# Patient Record
Sex: Female | Born: 1970 | Race: White | Hispanic: No | Smoking: Current some day smoker
Health system: Southern US, Community
[De-identification: ages and names within clinical notes are randomized; demographics above are authoritative.]

## PROBLEM LIST (undated history)

## (undated) DIAGNOSIS — M5030 Other cervical disc degeneration, unspecified cervical region: Secondary | ICD-10-CM

## (undated) DIAGNOSIS — F909 Attention-deficit hyperactivity disorder, unspecified type: Secondary | ICD-10-CM

## (undated) DIAGNOSIS — G5603 Carpal tunnel syndrome, bilateral upper limbs: Secondary | ICD-10-CM

## (undated) DIAGNOSIS — K259 Gastric ulcer, unspecified as acute or chronic, without hemorrhage or perforation: Secondary | ICD-10-CM

## (undated) DIAGNOSIS — F308 Other manic episodes: Principal | ICD-10-CM

## (undated) DIAGNOSIS — F419 Anxiety disorder, unspecified: Secondary | ICD-10-CM

## (undated) HISTORY — DX: Other cervical disc degeneration, unspecified cervical region: M50.30

## (undated) HISTORY — PX: TONSILLECTOMY: SUR1361

## (undated) HISTORY — PX: ABDOMINAL HYSTERECTOMY: SHX81

## (undated) HISTORY — DX: Carpal tunnel syndrome, bilateral upper limbs: G56.03

## (undated) HISTORY — DX: Gastric ulcer, unspecified as acute or chronic, without hemorrhage or perforation: K25.9

## (undated) HISTORY — DX: Other manic episodes: F30.8

## (undated) HISTORY — DX: Anxiety disorder, unspecified: F41.9

## (undated) HISTORY — PX: OTHER SURGICAL HISTORY: SHX169

## (undated) HISTORY — DX: Attention-deficit hyperactivity disorder, unspecified type: F90.9

## (undated) HISTORY — PX: BREAST SURGERY: SHX581

## (undated) HISTORY — PX: LIPOSUCTION: SHX10

---

## 2012-07-06 DIAGNOSIS — G5603 Carpal tunnel syndrome, bilateral upper limbs: Secondary | ICD-10-CM

## 2012-07-06 DIAGNOSIS — K259 Gastric ulcer, unspecified as acute or chronic, without hemorrhage or perforation: Secondary | ICD-10-CM

## 2012-07-06 DIAGNOSIS — M5030 Other cervical disc degeneration, unspecified cervical region: Secondary | ICD-10-CM

## 2012-07-06 HISTORY — DX: Carpal tunnel syndrome, bilateral upper limbs: G56.03

## 2012-07-06 HISTORY — DX: Gastric ulcer, unspecified as acute or chronic, without hemorrhage or perforation: K25.9

## 2012-07-06 HISTORY — DX: Other cervical disc degeneration, unspecified cervical region: M50.30

## 2013-01-26 DIAGNOSIS — F988 Other specified behavioral and emotional disorders with onset usually occurring in childhood and adolescence: Secondary | ICD-10-CM | POA: Insufficient documentation

## 2013-01-26 DIAGNOSIS — M722 Plantar fascial fibromatosis: Secondary | ICD-10-CM | POA: Insufficient documentation

## 2014-01-01 DIAGNOSIS — G5600 Carpal tunnel syndrome, unspecified upper limb: Secondary | ICD-10-CM | POA: Insufficient documentation

## 2014-01-01 DIAGNOSIS — M542 Cervicalgia: Secondary | ICD-10-CM | POA: Insufficient documentation

## 2014-01-01 DIAGNOSIS — M5030 Other cervical disc degeneration, unspecified cervical region: Secondary | ICD-10-CM | POA: Insufficient documentation

## 2014-11-09 ENCOUNTER — Ambulatory Visit (INDEPENDENT_AMBULATORY_CARE_PROVIDER_SITE_OTHER): Payer: 59 | Admitting: Physician Assistant

## 2014-11-09 ENCOUNTER — Encounter (HOSPITAL_COMMUNITY): Payer: Self-pay | Admitting: Physician Assistant

## 2014-11-09 VITALS — BP 136/80 | HR 84 | Ht 66.0 in | Wt 141.0 lb

## 2014-11-09 DIAGNOSIS — Z9149 Other personal history of psychological trauma, not elsewhere classified: Secondary | ICD-10-CM | POA: Diagnosis not present

## 2014-11-09 DIAGNOSIS — F309 Manic episode, unspecified: Secondary | ICD-10-CM

## 2014-11-09 DIAGNOSIS — F1210 Cannabis abuse, uncomplicated: Secondary | ICD-10-CM | POA: Diagnosis not present

## 2014-11-09 DIAGNOSIS — F1994 Other psychoactive substance use, unspecified with psychoactive substance-induced mood disorder: Secondary | ICD-10-CM

## 2014-11-09 DIAGNOSIS — IMO0002 Reserved for concepts with insufficient information to code with codable children: Secondary | ICD-10-CM

## 2014-11-09 MED ORDER — DIVALPROEX SODIUM 250 MG PO DR TAB
DELAYED_RELEASE_TABLET | ORAL | Status: DC
Start: 2014-11-09 — End: 2014-11-22
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
## 2014-11-09 NOTE — Patient Instructions (Signed)
1. Take all of your medications as discussed with your provider. (Please check your AVS, for the list.) 2. Call this office for any questions or problems. 3. Be sure to get plenty of rest and try for 7-9 hours of quality sleep each night. 4. Try to get regular exercise, at least 15-30 minutes each day.  A good walk will help tremendously! 5. Remember to do your mindfulness each day, breath deeply in and out, while having quiet reflection, prayer, meditation, or positive visualization. Unplug and turn off all electronic devices each day for your own personal time without interruption. This works! There are studies to back this up! 6. Be sure to take your B complex and Vitamin D3 each day. This will improve your overall wellbeing and boost your immune system as well. 7. Try to eat a nutritious healthy diet and avoid excessive alcohol and ALL tobacco products. 8. Be sure to keep all of your appointments with your outpatient therapist. If you do not have one, our office will be happy to assist you with this. 9. Be sure to keep your next follow up appointment in 2 weeks. 

## 2014-11-14 ENCOUNTER — Telehealth (HOSPITAL_COMMUNITY): Payer: Self-pay | Admitting: *Deleted

## 2014-11-14 ENCOUNTER — Encounter (HOSPITAL_COMMUNITY): Payer: Self-pay | Admitting: *Deleted

## 2014-11-14 NOTE — Telephone Encounter (Addendum)
Pt would like to speak to  [name]. Pt would like to increase divalproex (DEPAKOTE) 250 MG DR tablet to T.I.D. Please call to advise at. [phone]  Patient noted that she has not slept yet and wanted to increase the depakote to TID. She states she has already started this. She did have her labs done, and her Liver enzymes are elevated.  She is advised to take two Depakote in AM and 2 at hs. She is advised to keep her follow up appointment. She is advised not to drink Alcohol or to use any further Adderall.  She agrees to do this. And she will keep her follow up appointment.  Charlotte Sanchez T. Mashburn RPAC 5:54 PM 11/14/2014

## 2014-11-22 ENCOUNTER — Encounter (HOSPITAL_COMMUNITY): Payer: Self-pay | Admitting: Physician Assistant

## 2014-11-22 ENCOUNTER — Ambulatory Visit (INDEPENDENT_AMBULATORY_CARE_PROVIDER_SITE_OTHER): Payer: 59 | Admitting: Physician Assistant

## 2014-11-22 VITALS — BP 116/70 | HR 88 | Ht 66.0 in | Wt 148.0 lb

## 2014-11-22 DIAGNOSIS — F1021 Alcohol dependence, in remission: Secondary | ICD-10-CM

## 2014-11-22 DIAGNOSIS — F3010 Manic episode without psychotic symptoms, unspecified: Secondary | ICD-10-CM | POA: Diagnosis not present

## 2014-11-22 DIAGNOSIS — IMO0002 Reserved for concepts with insufficient information to code with codable children: Secondary | ICD-10-CM

## 2014-11-22 DIAGNOSIS — F1994 Other psychoactive substance use, unspecified with psychoactive substance-induced mood disorder: Secondary | ICD-10-CM

## 2014-11-22 DIAGNOSIS — F308 Other manic episodes: Secondary | ICD-10-CM

## 2014-11-22 DIAGNOSIS — F309 Manic episode, unspecified: Secondary | ICD-10-CM

## 2014-11-22 DIAGNOSIS — F1210 Cannabis abuse, uncomplicated: Secondary | ICD-10-CM

## 2014-11-22 HISTORY — DX: Other manic episodes: F30.8

## 2014-11-22 MED ORDER — DIVALPROEX SODIUM 250 MG PO DR TAB: DELAYED_RELEASE_TABLET | ORAL | Status: DC

## 2014-11-22 NOTE — Progress Notes (Signed)
[facility]  11/22/2014 1:23 PM Charlotte LewisMichelle Sanchez  MRN:  [id number] Subjective:  Patient notes that she is better. She states she doing much better. Thinks she may have had DTs, but had no complications, hasn't had any alcohol in two weeks. Her symptoms lasted 4 full days, she noted itching, sweats, nightmares, chills, nausea, feeling like her skin was crawling, low grade headache, but no hallucinations, no seizures.      Today she feels much better, family is noticing a difference. Her family [place] coming in from [place] tomorrow and she will be happy to see the differences. She says her friends have really noticed a big difference. She is very happy with her progress.  She states no alcohol, but she continues to smoke weed 4x a day. Principal Problem: Hypomania Diagnosis:   Patient Active Problem List   Diagnosis Date Noted  . 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 DDD (degenerative disc disease), cervical [M50.30] 01/01/2014  . Cervical pain [M54.2] 01/01/2014  . Carpal tunnel syndrome [G56.00] 01/01/2014  . Plantar fasciitis [M72.2] 01/26/2013  . ADD (attention deficit disorder) [F90.9] 01/26/2013   Total Time spent with patient: 20 minutes   Past Medical History:  Past Medical History  Diagnosis Date  . ADHD (attention deficit hyperactivity disorder)   . Anxiety   . Gastric ulcer 2014  . DDD (degenerative disc disease), cervical 2014  . Carpal tunnel syndrome, bilateral 2014  . Hypomania 11/22/2014    Past Surgical History  Procedure Laterality Date  . Abdominal hysterectomy    . Breast surgery    . Cosmestic surgergy      Tummy tuck  . Tonsillectomy    . Liposuction     Family History: No family history on file. Social History:  History  Alcohol Use  . 0.0 oz/week  . 0 Standard drinks or equivalent per week    Comment: 4 glasses of wine per day.     History  Drug Use  . Yes    History   Social History  . Marital Status: Single    Spouse Name: N/A  . Number of  Children: N/A  . Years of Education: N/A   Social History Main Topics  . Smoking status: Never Smoker   . Smokeless tobacco: Never Used  . Alcohol Use: 0.0 oz/week    0 Standard drinks or equivalent per week     Comment: 4 glasses of wine per day.  . Drug Use: Yes  . Sexual Activity:    Partners: Male   Other Topics Concern  . None   Social History Narrative   Additional History:    Sleep: not  So good yet  Appetite:  Good   Assessment:   Musculoskeletal: Strength & Muscle Tone: within normal limits Gait & Station: normal Patient leans: Normal   Psychiatric Specialty Exam: Physical Exam  ROS  There were no vitals taken for this visit.There is no weight on file to calculate BMI.  General Appearance: Fairly Groomed  Patent attorneyye Contact::  Good  Speech:  Pressured  Volume:  Normal  Mood:  Euphoric  Affect:  Congruent  Thought Process:  Circumstantial  Orientation:  Full (Time, Place, and Person)  Thought Content:  WDL  Suicidal Thoughts:  No  Homicidal Thoughts:  No  Memory:  Immediate;   Fair Recent;   Fair Remote;   Fair  Judgement:  Fair  Insight:  Good  Psychomotor Activity:  Increased  Concentration:  fair  Recall:  Good  Fund of Knowledge:Good  Language: Good  Akathisia:  No  Handed:  Right  AIMS (if indicated):     Assets:  Communication Skills Desire for Improvement Financial Resources/Insurance Housing Leisure Time Physical Health Resilience Social Support Talents/Skills Transportation Vocational/Educational  ADL's:  Intact  Cognition: WNL  Sleep:        Current Medications: Current Outpatient Prescriptions  Medication Sig Dispense Refill  . divalproex (DEPAKOTE) 250 MG DR tablet Take two tablets tonight at bedtime, then 1 tablet each morning and each evening. 60 tablet 0  . clotrimazole (LOTRIMIN) 1 % cream   0   No current facility-administered medications for this visit.    Lab Results: No results found for this or any previous  visit (from the past 48 hour(s)).  Physical Findings: AIMS:  CIWA:   COWS:    Treatment Plan Summary: Hypomanic Alcohol abuse-early remission Cannabis abuse-chronic   Medical Decision Making:  Established Problem, Stable/Improving (1) and New Problem, with no additional work-up planned (3)  1. Depakote 250mg   Take as follows:     500mg  in AM with breakfast     250mg  at lunch     750mg  at bedtime with a snack. 2. Continue to get regular exercise as discussed. 3. Decrease Cannabis use as discussed. 4. Follow up 3-4 weeks. 5. Will do Depokote level at that time.    Charlotte Sanchez RPAC 1:32 PM 11/22/2014

## 2014-11-22 NOTE — Patient Instructions (Signed)
1. Take all of your medications as discussed with your provider. (Please check your AVS, for the list.)  2. Call this office for any questions or problems.  3. Be sure to get plenty of rest and try for 7-9 hours of quality sleep each night.  4. Try to get regular exercise, at least 15-30 minutes each day.  A good walk will help tremendously!  5. Remember to do your mindfulness each day, breath deeply in and out, while having quiet reflection, prayer, meditation, or positive visualization. Unplug and turn off all electronic devices each day for your own personal time without interruption. This works! There are studies to back this up!  6. Be sure to take your B complex and Vitamin D3 each day. This will improve your overall wellbeing and boost your immune system as well.  7. Try to eat a nutritious healthy diet and avoid all alcohol and ALL tobacco products.  8. Be sure to keep all of your appointments with your outpatient therapist. If you do not have one, our office will be happy to assist you with this.  9. Be sure to keep your next follow up appointment in

## 2014-11-23 ENCOUNTER — Ambulatory Visit (HOSPITAL_COMMUNITY): Payer: 59 | Admitting: Physician Assistant

## 2014-12-13 ENCOUNTER — Ambulatory Visit (HOSPITAL_COMMUNITY): Payer: 59 | Admitting: Physician Assistant

## 2014-12-13 ENCOUNTER — Encounter (HOSPITAL_COMMUNITY): Payer: Self-pay | Admitting: Physician Assistant

## 2014-12-13 VITALS — BP 110/66 | HR 86 | Ht 66.0 in | Wt 148.0 lb

## 2014-12-14 ENCOUNTER — Ambulatory Visit (INDEPENDENT_AMBULATORY_CARE_PROVIDER_SITE_OTHER): Payer: 59 | Admitting: Physician Assistant

## 2014-12-14 ENCOUNTER — Encounter (HOSPITAL_COMMUNITY): Payer: Self-pay | Admitting: Physician Assistant

## 2014-12-14 VITALS — BP 119/79 | HR 90 | Ht 66.5 in | Wt 148.0 lb

## 2014-12-14 DIAGNOSIS — Z9149 Other personal history of psychological trauma, not elsewhere classified: Secondary | ICD-10-CM

## 2014-12-14 DIAGNOSIS — F1210 Cannabis abuse, uncomplicated: Secondary | ICD-10-CM | POA: Diagnosis not present

## 2014-12-14 DIAGNOSIS — F1021 Alcohol dependence, in remission: Secondary | ICD-10-CM

## 2014-12-14 DIAGNOSIS — F309 Manic episode, unspecified: Secondary | ICD-10-CM

## 2014-12-14 DIAGNOSIS — IMO0002 Reserved for concepts with insufficient information to code with codable children: Secondary | ICD-10-CM

## 2014-12-14 DIAGNOSIS — F3010 Manic episode without psychotic symptoms, unspecified: Secondary | ICD-10-CM | POA: Diagnosis not present

## 2014-12-14 DIAGNOSIS — F1994 Other psychoactive substance use, unspecified with psychoactive substance-induced mood disorder: Secondary | ICD-10-CM

## 2014-12-14 DIAGNOSIS — F308 Other manic episodes: Secondary | ICD-10-CM

## 2014-12-14 MED ORDER — DIVALPROEX SODIUM 250 MG PO DR TAB: DELAYED_RELEASE_TABLET | ORAL | Status: AC

## 2014-12-14 NOTE — Progress Notes (Signed)
Patient ID: Charlotte Sanchez, female   DOB: 06-Sep-1970, 44 y.o.   MRN: [id number] [facility] MD Progress Note  12/14/2014 1:16 PM Briyanna Grinnan  MRN:  [id number] Subjective:  Patient states she is doing very well. She has started a new job, but unfortunately she has started drinking again, 2 glasses of wine or 3 beers a day, and she notes that she has cut her THC consumption down by half but she is still smoking daily.  Her mood is speeding up again and she feels that she is apologizing for everything again.  Principal Problem: Hypomania Diagnosis:   Patient Active Problem List   Diagnosis Date Noted  . 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 DDD (degenerative disc disease), cervical [M50.30] 01/01/2014  . Cervical pain [M54.2] 01/01/2014  . Carpal tunnel syndrome [G56.00] 01/01/2014  . Plantar fasciitis [M72.2] 01/26/2013  . ADD (attention deficit disorder) [F90.9] 01/26/2013   Total Time spent with patient: 30 minutes   Past Medical History:  Past Medical History  Diagnosis Date  . ADHD (attention deficit hyperactivity disorder)   . Anxiety   . Gastric ulcer 2014  . DDD (degenerative disc disease), cervical 2014  . Carpal tunnel syndrome, bilateral 2014  . Hypomania 11/22/2014    Past Surgical History  Procedure Laterality Date  . Abdominal hysterectomy    . Breast surgery    . Cosmestic surgergy      Tummy tuck  . Tonsillectomy    . Liposuction     Family History: No family history on file. Social History:  History  Alcohol Use  . 0.0 oz/week  . 0 Standard drinks or equivalent per week    Comment: 2 glasses of wine, 3 beers occassionally     History  Drug Use  . Yes    Comment: smoking weed daily but has cut down consumption    History   Social History  . Marital Status: Single    Spouse Name: N/A  . Number of Children: N/A  . Years of Education: N/A   Social History Main Topics  . Smoking status: Never Smoker   . Smokeless tobacco: Never Used  . Alcohol Use: 0.0  oz/week    0 Standard drinks or equivalent per week     Comment: 2 glasses of wine, 3 beers occassionally  . Drug Use: Yes     Comment: smoking weed daily but has cut down consumption  . Sexual Activity:    Partners: Male   Other Topics Concern  . None   Social History Narrative   Additional History:  Sleep: not  So good yet  Appetite:  Good   Assessment:   Musculoskeletal: Strength & Muscle Tone: within normal limits Gait & Station: normal Patient leans: Normal   Psychiatric Specialty Exam: Physical Exam  ROS  Blood pressure 119/79, pulse 90, height 5' 6.5" (1.689 m), weight 148 lb (67.132 kg).Body mass index is 23.53 kg/(m^2).  General Appearance: Fairly Groomed  Patent attorney::  Good  Speech:  circumstantial  Volume:  Normal  Mood:  euthymic  Affect:  Congruent  Thought Process:  Circumstantial  Orientation:  Full (Time, Place, and Person)  Thought Content:  WDL  Suicidal Thoughts:  No  Homicidal Thoughts:  No  Memory:  Immediate;   Fair Recent;   Fair Remote;   Fair  Judgement:  Fair  Insight:  Good  Psychomotor Activity:  Increased  Concentration:  fair  Recall:  Good  Fund of Knowledge:Good  Language: Good  Akathisia:  No  Handed:  Right  AIMS (if indicated):     Assets:  Communication Skills Desire for Improvement Financial Resources/Insurance Housing Leisure Time Physical Health Resilience Social Support Talents/Skills Transportation Vocational/Educational  ADL's:  Intact  Cognition: WNL  Sleep:   fair     Current Medications: Current Outpatient Prescriptions  Medication Sig Dispense Refill  . celecoxib (CELEBREX) 200 MG capsule     . clotrimazole (LOTRIMIN) 1 % cream   0  . divalproex (DEPAKOTE) 250 MG DR tablet Take two tablets each morning ( ), then  at lunch, then  at Bedtime with a snack. 180 tablet 0   No current facility-administered medications for this visit.    Lab Results: No results found for this or  any previous visit (from the past 48 hour(s)).  Physical Findings: AIMS:  CIWA:   COWS:    Treatment Plan Summary: Hypomanic Alcohol abuse-relapsed Cannabis abuse-chronic  Assessment: patient has enjoyed the success of her changes in her behavior but has over compensated and begun using ETOH again, but has cut her THC consumption in half. After discussion Sharda has elected to d/c [facility] and she will use ETOH 2 x per week, no greater than 1 glass of wine or 1 beer per time.  Medical Decision Making:  Established Problem, Stable/Improving (1) and New Problem, with no additional work-up planned (3)  1. Depakote   Take as follows:      in AM with breakfast      at lunch      at bedtime with a snack. 2. Continue to get regular exercise as discussed. 3. Will order Depakote level today. 4. Follow up 3-4 weeks.     Will also order CMP as well.    Rona Ravens. Kahlen Morais RPAC 1:16 PM 12/14/2014

## 2014-12-14 NOTE — Patient Instructions (Signed)
1. Continue all medication as ordered. 2. Call this office if you have any questions or concerns. 3. Continue to get regular exercise 3-5 times a week. 4. Continue to eat a healthy nutritionally balanced diet. 5. Continue to reduce stress and anxiety through activities such as yoga, mindfulness, meditation and or prayer. 6. Keep all appointments with your out patient therapist and have notes forwarded to this office. (If you do not have one and would like to be scheduled with a therapist, please let our office assist you with this. 7. Follow up as planned 3-4 weeks.  Get your labs as ordered today.

## 2014-12-15 LAB — HEPATIC FUNCTION PANEL
ALK PHOS: 52 U/L (ref 39–117)
ALT: 66 U/L — ABNORMAL HIGH (ref 0–35)
AST: 94 U/L — AB (ref 0–37)
Albumin: 4.4 g/dL (ref 3.5–5.2)
Bilirubin, Direct: 0.1 mg/dL (ref 0.0–0.3)
Indirect Bilirubin: 0.4 mg/dL (ref 0.2–1.2)
Total Bilirubin: 0.5 mg/dL (ref 0.2–1.2)
Total Protein: 6.8 g/dL (ref 6.0–8.3)

## 2014-12-15 LAB — CMP AND LIVER
ALK PHOS: 52 U/L (ref 39–117)
ALT: 66 U/L — AB (ref 0–35)
AST: 94 U/L — AB (ref 0–37)
Albumin: 4.4 g/dL (ref 3.5–5.2)
BILIRUBIN INDIRECT: 0.4 mg/dL (ref 0.2–1.2)
BUN: 16 mg/dL (ref 6–23)
Bilirubin, Direct: 0.1 mg/dL (ref 0.0–0.3)
CO2: 23 mEq/L (ref 19–32)
Calcium: 9.7 mg/dL (ref 8.4–10.5)
Chloride: 103 mEq/L (ref 96–112)
Creat: 0.63 mg/dL (ref 0.50–1.10)
Glucose, Bld: 91 mg/dL (ref 70–99)
POTASSIUM: 4.8 meq/L (ref 3.5–5.3)
Sodium: 141 mEq/L (ref 135–145)
Total Bilirubin: 0.5 mg/dL (ref 0.2–1.2)
Total Protein: 6.8 g/dL (ref 6.0–8.3)

## 2014-12-15 LAB — VALPROIC ACID LEVEL: VALPROIC ACID LVL: 74.9 ug/mL (ref 50.0–100.0)

## 2014-12-20 ENCOUNTER — Encounter (HOSPITAL_COMMUNITY): Payer: Self-pay | Admitting: Physician Assistant

## 2014-12-23 ENCOUNTER — Other Ambulatory Visit (HOSPITAL_COMMUNITY): Payer: Self-pay | Admitting: Physician Assistant

## 2014-12-26 NOTE — Progress Notes (Signed)
Patient ID: Charlotte Sanchez, female   DOB: [birth year], 43 y.o.   MRN: [id number] OV rescheduled.

## 2015-01-15 ENCOUNTER — Other Ambulatory Visit (HOSPITAL_COMMUNITY): Payer: Self-pay | Admitting: Physician Assistant

## 2015-01-23 NOTE — Telephone Encounter (Signed)
PT will need to contact the office for an appt. 

## 2016-08-04 ENCOUNTER — Institutional Professional Consult (permissible substitution): Payer: 59 | Admitting: Emergency Medicine

## 2016-08-07 ENCOUNTER — Ambulatory Visit (INDEPENDENT_AMBULATORY_CARE_PROVIDER_SITE_OTHER): Payer: Self-pay | Admitting: Emergency Medicine

## 2016-08-07 ENCOUNTER — Encounter: Payer: Self-pay | Admitting: Emergency Medicine

## 2016-08-07 DIAGNOSIS — R053 Chronic cough: Secondary | ICD-10-CM | POA: Insufficient documentation

## 2016-08-07 DIAGNOSIS — R05 Cough: Secondary | ICD-10-CM

## 2016-08-07 MED ORDER — FLUTICASONE PROPIONATE 50 MCG/ACT NA SUSP: 2.0000 | Freq: Every day | NASAL | 2 refills | Status: DC

## 2016-08-07 NOTE — Addendum Note (Signed)
Addended by: Pamalee LeydenWIGGINS, Anyra Kaufman J on: 08/07/2016 04:40 PM   Modules accepted: Orders

## 2016-08-07 NOTE — Progress Notes (Signed)
Subjective:    Patient ID: Charlotte Sanchez, female    DOB: 09/21/1970, 46 y.o.   MRN: [id number]  HPI 46 year old woman, never smoker, with a history of allergies, anxiety, ADD, PUD, EtOH abuse, prior sinus surgery 1989-90 for a trauma. She is a self referral, here today to discuss chronic cough. She has been coughing for several years. She sees greenish mucous that comes from her sinuses and also from her chest / throat. No blood. She can move air through her nose and head. No vertigo but maybe some lightheadedness. Occasional HA. Has been treated for sinusitis before, she can't recall whether it changed her mucous. No SOB, no wheeze.    Review of Systems  Constitutional: Negative.  Negative for fever and unexpected weight change.  HENT: Positive for congestion. Negative for dental problem, ear pain, nosebleeds, postnasal drip, rhinorrhea, sinus pressure, sneezing, sore throat and trouble swallowing.   Eyes: Negative.  Negative for redness and itching.  Respiratory: Positive for cough. Negative for chest tightness, shortness of breath and wheezing.   Cardiovascular: Positive for leg swelling. Negative for palpitations.  Gastrointestinal: Negative.  Negative for nausea and vomiting.  Endocrine: Negative.   Genitourinary: Negative.  Negative for dysuria.  Musculoskeletal: Negative.  Negative for joint swelling.  Skin: Negative.  Negative for rash.  Allergic/Immunologic: Positive for environmental allergies.  Neurological: Negative.  Negative for headaches.  Hematological: Negative.  Does not bruise/bleed easily.  Psychiatric/Behavioral: Negative.  Negative for dysphoric mood. The patient is not nervous/anxious.     Past Medical History:  Diagnosis Date  . ADHD (attention deficit hyperactivity disorder)   . Anxiety   . Carpal tunnel syndrome, bilateral 2014  . DDD (degenerative disc disease), cervical 2014  . Gastric ulcer 2014  . Hypomania (HCC) 11/22/2014     No family history on  file.   Social History   Social History  . Marital status: Single    Spouse name: N/A  . Number of children: N/A  . Years of education: N/A   Occupational History  . Not on file.   Social History Main Topics  . Smoking status: Current Some Day Smoker    Types: E-cigarettes  . Smokeless tobacco: Never Used     Comment: Pt. uses a vape  . Alcohol use 0.0 oz/week     Comment: 2 glasses of wine, 3 beers occassionally  . Drug use: Yes     Comment: smoking weed daily but has cut down consumption  . Sexual activity: Yes    Partners: Male   Other Topics Concern  . Not on file   Social History Narrative  . No narrative on file  [place] she is exposed to dyes and chemicals    No Known Allergies   Outpatient Medications Prior to Visit  Medication Sig Dispense Refill  . celecoxib (CELEBREX) 200 MG capsule     . divalproex (DEPAKOTE) 250 MG DR tablet Take two tablets each morning (500mg ), then 250mg  at lunch, then 750mg  at Bedtime with a snack. 180 tablet 0  . clotrimazole (LOTRIMIN) 1 % cream   0   No facility-administered medications prior to visit.         Objective:   Physical Exam Vitals:   08/07/16 1558  BP: 120/80  Pulse: 77  SpO2: 100%  Weight: 162 lb 3.2 oz (73.6 kg)  Height: 5' 6.5" (1.689 m)   Gen: Pleasant, well-nourished, in no distress,  normal affect  ENT: No lesions,  mouth clear,  oropharynx clear, no postnasal drip  Neck: No JVD, no TMG, no carotid bruits  Lungs: No use of accessory muscles, clear without rales or rhonchi  Cardiovascular: RRR, heart sounds normal, no murmur or gallops, no peripheral edema  Musculoskeletal: No deformities, no cyanosis or clubbing  Neuro: alert, non focal  Skin: Warm, no lesions or rashes      Assessment & Plan:  Chronic cough Based on history I suspect that there are contributions of allergic rhinitis, possibly also chronic sinus disease given her often purulent nasal  discharge and her hx of sinus trauma and sgy. She denies any GERD sx. We will treat allergic disease, start nasal washes. Check sputum cx. CT scan sinuses, CXR.   If no better and depending on w/u may need PFT, FOB.   We will perform a CXR today.  We will perform a CT scan of your sinuses We will get a sputum sample for culture.  Start fluticasone NS, 2 sprays each nostril once a day.  We will start doing nasal saline rinses once a day.  We will consider breathing tests in the future based on our progress We will consider doing bronchoscopy in the future depending on how we progress.  Follow with Dr Delton CoombesByrum in 1 month  Levy Pupaobert Rubena Roseman, MD, PhD 08/07/2016, 4:26 PM [facility] [phone] or if no answer [phone]

## 2016-08-07 NOTE — Assessment & Plan Note (Signed)
Based on history I suspect that there are contributions of allergic rhinitis, possibly also chronic sinus disease given her often purulent nasal discharge and her hx of sinus trauma and sgy. She denies any GERD sx. We will treat allergic disease, start nasal washes. Check sputum cx. CT scan sinuses, CXR.   If no better and depending on w/u may need PFT, FOB.   We will perform a CXR today.  We will perform a CT scan of your sinuses We will get a sputum sample for culture.  Start fluticasone NS, 2 sprays each nostril once a day.  We will start doing nasal saline rinses once a day.  We will consider breathing tests in the future based on our progress We will consider doing bronchoscopy in the future depending on how we progress.  Follow with Dr Delton CoombesByrum in 1 month

## 2016-08-07 NOTE — Addendum Note (Signed)
Addended by: Pamalee LeydenWIGGINS, Alaycia Eardley J on: 08/07/2016 04:45 PM   Modules accepted: Orders

## 2016-08-07 NOTE — Patient Instructions (Addendum)
We will perform a CXR today.  We will perform a CT scan of your sinuses We will get a sputum sample for culture.  Start fluticasone NS, 2 sprays each nostril once a day.  We will start doing nasal saline rinses once a day.  We will consider breathing tests in the future based on our progress We will consider doing bronchoscopy in the future depending on how we progress.  Follow with Dr Delton CoombesByrum in 1 month

## 2016-08-10 ENCOUNTER — Other Ambulatory Visit: Payer: Self-pay

## 2016-08-10 ENCOUNTER — Ambulatory Visit (INDEPENDENT_AMBULATORY_CARE_PROVIDER_SITE_OTHER)
Admission: RE | Admit: 2016-08-10 | Discharge: 2016-08-10 | Disposition: A | Discharge status: Home / Self Care | Payer: Self-pay | Source: Ambulatory Visit | Attending: Emergency Medicine | Admitting: Emergency Medicine

## 2016-08-10 DIAGNOSIS — R053 Chronic cough: Secondary | ICD-10-CM

## 2016-08-10 DIAGNOSIS — R05 Cough: Secondary | ICD-10-CM

## 2016-08-11 ENCOUNTER — Telehealth: Payer: Self-pay | Admitting: Emergency Medicine

## 2016-08-11 NOTE — Telephone Encounter (Signed)
Spoke to pt.  She wants to go ahead & schedule CT at [facility] in [place].  I called [facility] & spoke to [place]ul[place]arol[place].  She states she will call pt to schedule.  I checked & Eber JonesCarolyn has her scheduled for 2/9.  [facility]  Will route back to RB.

## 2016-08-11 NOTE — Telephone Encounter (Signed)
Pt requesting sputum culture and cxr results.  RB please advise.  Thanks!   Pt also states she is returning a call to schedule ct sinus.   [facility]'s please advise.  Thanks.

## 2016-08-12 NOTE — Telephone Encounter (Signed)
resp cx is still pending CXR was normal

## 2016-08-13 LAB — RESPIRATORY CULTURE OR RESPIRATORY AND SPUTUM CULTURE: Organism ID, Bacteria: NORMAL

## 2016-08-14 ENCOUNTER — Ambulatory Visit (HOSPITAL_BASED_OUTPATIENT_CLINIC_OR_DEPARTMENT_OTHER): Payer: Self-pay

## 2016-09-03 ENCOUNTER — Ambulatory Visit (HOSPITAL_BASED_OUTPATIENT_CLINIC_OR_DEPARTMENT_OTHER): Payer: Self-pay

## 2016-09-10 ENCOUNTER — Ambulatory Visit: Payer: Self-pay | Admitting: Emergency Medicine

## 2016-11-22 ENCOUNTER — Other Ambulatory Visit: Payer: Self-pay | Admitting: Emergency Medicine

## 2018-05-16 IMAGING — DX DG CHEST 2V
2 series · 2 of 2 positions shown · non-contrast
Comparison: None.

CLINICAL DATA: Chronic chest congestion and cough.

EXAM:
CHEST  2 VIEW

[chest pa]
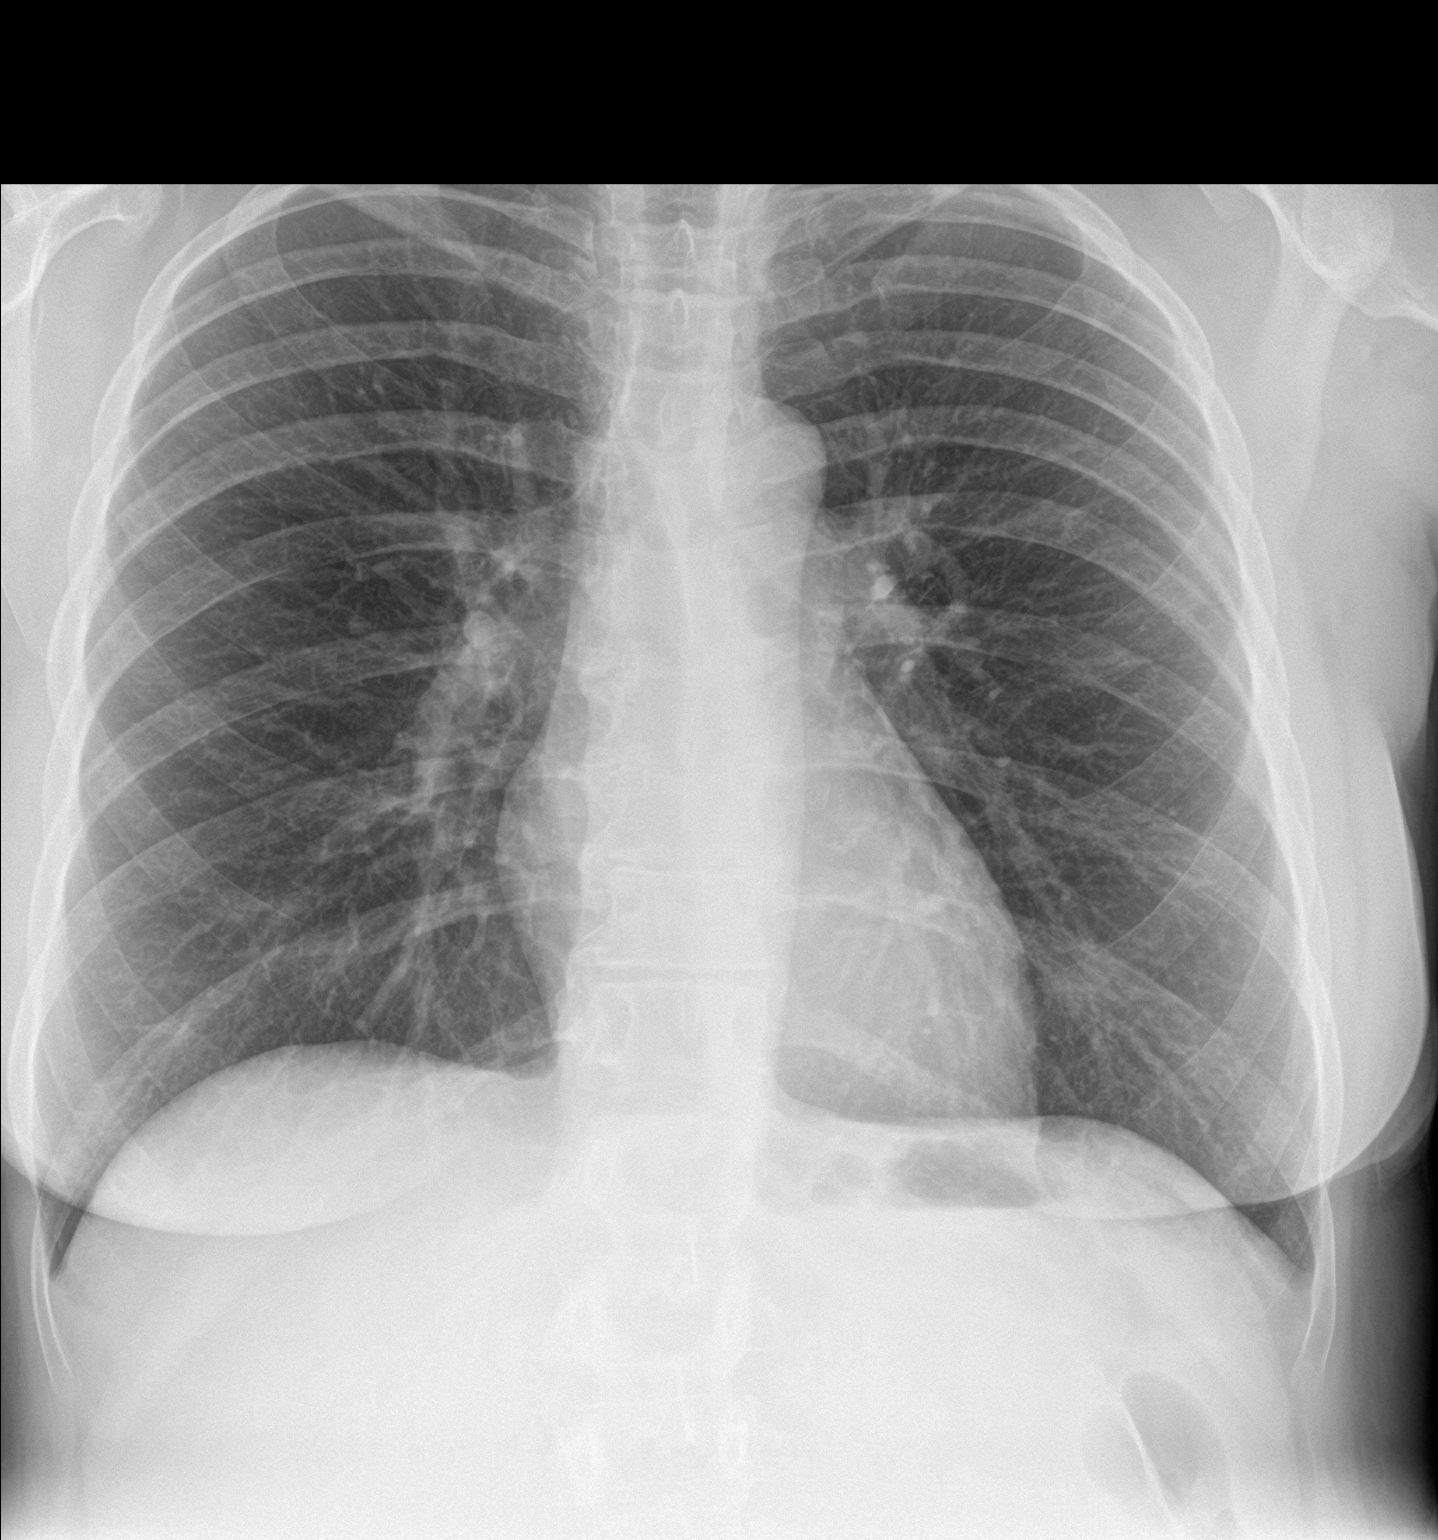

[chest lat]
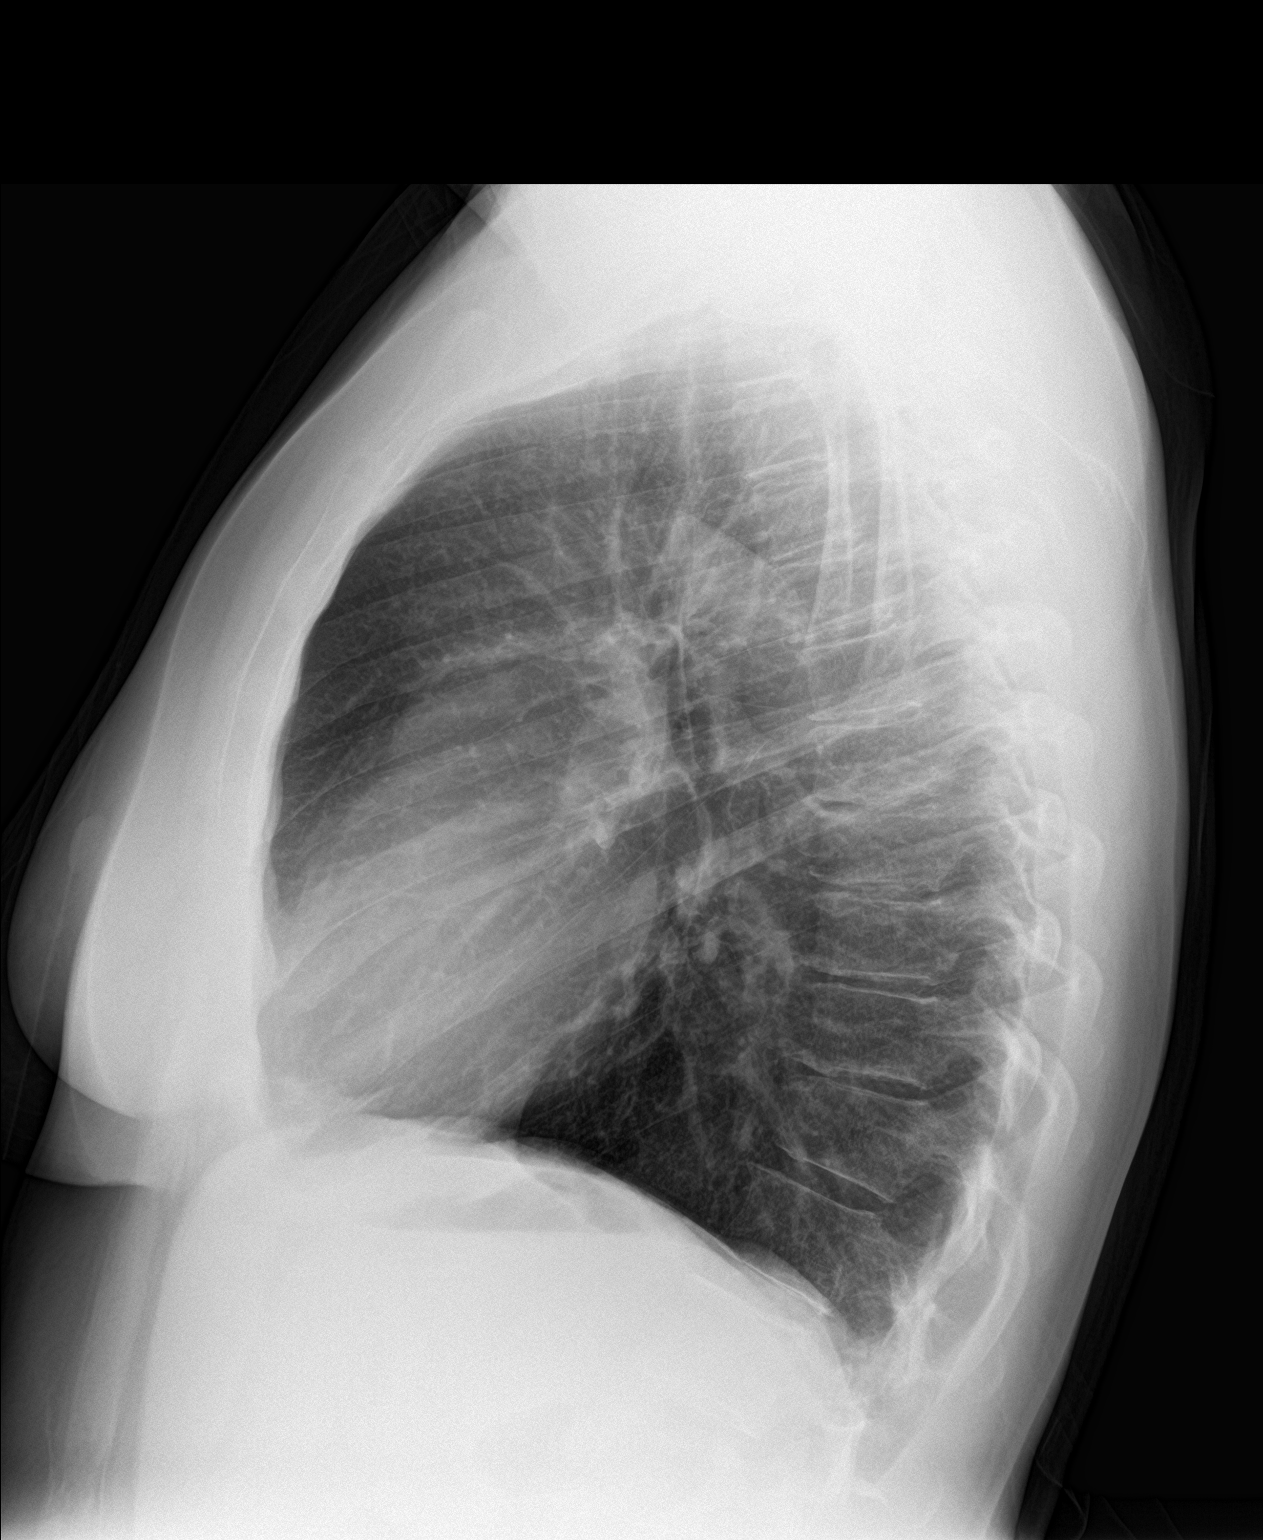

[2 of 2 positions shown; findings below may reference images not displayed]

FINDINGS: The heart size and mediastinal contours are within normal limits.
The lungs are mildly hyperexpanded but clear. No pleural effusion or
pneumothorax. The visualized skeletal structures are unremarkable.
IMPRESSION: No active cardiopulmonary disease.
